# Patient Record
Sex: Male | Born: 1960 | Race: Black or African American | Hispanic: No | Marital: Single | Smoking: Never smoker
Health system: Southern US, Community
[De-identification: ages and names within clinical notes are randomized; demographics above are authoritative.]

## PROBLEM LIST (undated history)

## (undated) DIAGNOSIS — I10 Essential (primary) hypertension: Secondary | ICD-10-CM

## (undated) DIAGNOSIS — K7460 Unspecified cirrhosis of liver: Secondary | ICD-10-CM

## (undated) DIAGNOSIS — E119 Type 2 diabetes mellitus without complications: Secondary | ICD-10-CM

---

## 2018-12-25 ENCOUNTER — Emergency Department (HOSPITAL_BASED_OUTPATIENT_CLINIC_OR_DEPARTMENT_OTHER): Payer: Medicaid Other

## 2018-12-25 ENCOUNTER — Other Ambulatory Visit: Payer: Self-pay

## 2018-12-25 ENCOUNTER — Emergency Department (HOSPITAL_BASED_OUTPATIENT_CLINIC_OR_DEPARTMENT_OTHER)
Admission: EM | Admit: 2018-12-25 | Discharge: 2018-12-25 | Disposition: A | Discharge status: Other Acute Inpatient Hospital | Payer: Medicaid Other | Attending: Emergency Medicine | Admitting: Emergency Medicine

## 2018-12-25 ENCOUNTER — Encounter (HOSPITAL_BASED_OUTPATIENT_CLINIC_OR_DEPARTMENT_OTHER): Payer: Self-pay | Admitting: Adult Health

## 2018-12-25 DIAGNOSIS — N179 Acute kidney failure, unspecified: Secondary | ICD-10-CM | POA: Diagnosis not present

## 2018-12-25 DIAGNOSIS — R600 Localized edema: Secondary | ICD-10-CM | POA: Diagnosis not present

## 2018-12-25 DIAGNOSIS — A419 Sepsis, unspecified organism: Secondary | ICD-10-CM | POA: Insufficient documentation

## 2018-12-25 DIAGNOSIS — Z79899 Other long term (current) drug therapy: Secondary | ICD-10-CM | POA: Diagnosis not present

## 2018-12-25 DIAGNOSIS — R079 Chest pain, unspecified: Secondary | ICD-10-CM | POA: Diagnosis present

## 2018-12-25 DIAGNOSIS — I10 Essential (primary) hypertension: Secondary | ICD-10-CM | POA: Insufficient documentation

## 2018-12-25 DIAGNOSIS — E119 Type 2 diabetes mellitus without complications: Secondary | ICD-10-CM | POA: Insufficient documentation

## 2018-12-25 HISTORY — DX: Unspecified cirrhosis of liver: K74.60

## 2018-12-25 HISTORY — DX: Type 2 diabetes mellitus without complications: E11.9

## 2018-12-25 HISTORY — DX: Essential (primary) hypertension: I10

## 2018-12-25 LAB — CBC WITH DIFFERENTIAL/PLATELET
ABS IMMATURE GRANULOCYTES: 0.2 10*3/uL — AB (ref 0.00–0.07)
BASOS PCT: 0 %
Basophils Absolute: 0.1 10*3/uL (ref 0.0–0.1)
Eosinophils Absolute: 0.4 10*3/uL (ref 0.0–0.5)
Eosinophils Relative: 2 %
HCT: 35.7 % — ABNORMAL LOW (ref 39.0–52.0)
Hemoglobin: 11.1 g/dL — ABNORMAL LOW (ref 13.0–17.0)
Immature Granulocytes: 1 %
Lymphocytes Relative: 3 %
Lymphs Abs: 0.6 10*3/uL — ABNORMAL LOW (ref 0.7–4.0)
MCH: 30.2 pg (ref 26.0–34.0)
MCHC: 31.1 g/dL (ref 30.0–36.0)
MCV: 97.3 fL (ref 80.0–100.0)
Monocytes Absolute: 0.7 10*3/uL (ref 0.1–1.0)
Monocytes Relative: 4 %
Neutro Abs: 16.4 10*3/uL — ABNORMAL HIGH (ref 1.7–7.7)
Neutrophils Relative %: 90 %
PLATELETS: 85 10*3/uL — AB (ref 150–400)
RBC: 3.67 MIL/uL — ABNORMAL LOW (ref 4.22–5.81)
RDW: 15.3 % (ref 11.5–15.5)
WBC: 18.4 10*3/uL — ABNORMAL HIGH (ref 4.0–10.5)
nRBC: 0.1 % (ref 0.0–0.2)

## 2018-12-25 LAB — COMPREHENSIVE METABOLIC PANEL
ALT: 22 U/L (ref 0–44)
AST: 32 U/L (ref 15–41)
Albumin: 2.5 g/dL — ABNORMAL LOW (ref 3.5–5.0)
Alkaline Phosphatase: 68 U/L (ref 38–126)
Anion gap: 10 (ref 5–15)
BUN: 34 mg/dL — ABNORMAL HIGH (ref 6–20)
CO2: 13 mmol/L — ABNORMAL LOW (ref 22–32)
Calcium: 8.2 mg/dL — ABNORMAL LOW (ref 8.9–10.3)
Chloride: 115 mmol/L — ABNORMAL HIGH (ref 98–111)
Creatinine, Ser: 3.74 mg/dL — ABNORMAL HIGH (ref 0.61–1.24)
GFR calc Af Amer: 20 mL/min — ABNORMAL LOW (ref >60)
GFR calc non Af Amer: 17 mL/min — ABNORMAL LOW (ref >60)
Glucose, Bld: 67 mg/dL — ABNORMAL LOW (ref 70–99)
POTASSIUM: 4.4 mmol/L (ref 3.5–5.1)
Sodium: 138 mmol/L (ref 135–145)
Total Bilirubin: 1.6 mg/dL — ABNORMAL HIGH (ref 0.3–1.2)
Total Protein: 6.8 g/dL (ref 6.5–8.1)

## 2018-12-25 LAB — CBG MONITORING, ED
GLUCOSE-CAPILLARY: 53 mg/dL — AB (ref 70–99)
Glucose-Capillary: 96 mg/dL (ref 70–99)
Glucose-Capillary: 95 mg/dL (ref 70–99)
Glucose-Capillary: 134 mg/dL — ABNORMAL HIGH (ref 70–99)

## 2018-12-25 LAB — URINALYSIS, MICROSCOPIC (REFLEX)

## 2018-12-25 LAB — URINALYSIS, ROUTINE W REFLEX MICROSCOPIC
BILIRUBIN URINE: NEGATIVE
Glucose, UA: NEGATIVE mg/dL
KETONES UR: NEGATIVE mg/dL
Nitrite: NEGATIVE
Protein, ur: NEGATIVE mg/dL
Specific Gravity, Urine: 1.02 (ref 1.005–1.030)
pH: 6 (ref 5.0–8.0)

## 2018-12-25 LAB — LACTIC ACID, PLASMA
LACTIC ACID, VENOUS: 3.9 mmol/L — AB (ref 0.5–1.9)
Lactic Acid, Venous: 4.6 mmol/L (ref 0.5–1.9)
Lactic Acid, Venous: 4 mmol/L (ref 0.5–1.9)

## 2018-12-25 LAB — D-DIMER, QUANTITATIVE: D-Dimer, Quant: 14.96 ug/mL-FEU — ABNORMAL HIGH (ref 0.00–0.50)

## 2018-12-25 LAB — INFLUENZA PANEL BY PCR (TYPE A & B)
Influenza A By PCR: NEGATIVE
Influenza B By PCR: NEGATIVE

## 2018-12-25 LAB — TROPONIN I: Troponin I: <0.03 ng/mL (ref <0.03)

## 2018-12-25 MED ORDER — GLUCOSE 40 % PO GEL
ORAL | Status: AC
  Administered 2018-12-25: 11:00:00
  Filled 2018-12-25: qty 1

## 2018-12-25 MED ORDER — VANCOMYCIN VARIABLE DOSE PER UNSTABLE RENAL FUNCTION (PHARMACIST DOSING)
Status: DC
  Filled 2018-12-25: qty 1

## 2018-12-25 MED ORDER — VANCOMYCIN HCL IN DEXTROSE 1-5 GM/200ML-% IV SOLN
1000.0000 mg | Freq: Once | INTRAVENOUS | Status: AC
  Administered 2018-12-25: 1000 mg via INTRAVENOUS
  Filled 2018-12-25: qty 200

## 2018-12-25 MED ORDER — SODIUM CHLORIDE 0.9 % IV BOLUS
1000.0000 mL | Freq: Once | INTRAVENOUS | Status: AC
  Administered 2018-12-25: 1000 mL via INTRAVENOUS

## 2018-12-25 MED ORDER — SODIUM CHLORIDE 0.9 % IV SOLN
2.0000 g | Freq: Once | INTRAVENOUS | Status: AC
  Administered 2018-12-25: 2 g via INTRAVENOUS
  Filled 2018-12-25: qty 2

## 2018-12-25 MED ORDER — SODIUM CHLORIDE 0.9 % IV BOLUS (SEPSIS)
500.0000 mL | Freq: Once | INTRAVENOUS | Status: AC
  Administered 2018-12-25: 500 mL via INTRAVENOUS

## 2018-12-25 MED ORDER — SODIUM CHLORIDE 0.9 % IV BOLUS (SEPSIS)
1000.0000 mL | Freq: Once | INTRAVENOUS | Status: AC
  Administered 2018-12-25: 1000 mL via INTRAVENOUS

## 2018-12-25 MED ORDER — DIPHENHYDRAMINE HCL 50 MG/ML IJ SOLN
25.0000 mg | Freq: Once | INTRAMUSCULAR | Status: AC
  Administered 2018-12-25: 25 mg via INTRAVENOUS
  Filled 2018-12-25: qty 1

## 2018-12-25 MED ORDER — SODIUM CHLORIDE 0.9 % IV SOLN: 1.0000 g | INTRAVENOUS | Status: DC

## 2018-12-25 MED ORDER — CEFEPIME HCL 2 G IJ SOLR
INTRAMUSCULAR | Status: AC
  Filled 2018-12-25: qty 2

## 2018-12-25 MED ORDER — SODIUM CHLORIDE 0.9 % IV BOLUS (SEPSIS): 1000.0000 mL | Freq: Once | INTRAVENOUS | Status: DC

## 2018-12-25 MED ORDER — METRONIDAZOLE IN NACL 5-0.79 MG/ML-% IV SOLN
500.0000 mg | Freq: Three times a day (TID) | INTRAVENOUS | Status: DC
  Administered 2018-12-25 (×2): 500 mg via INTRAVENOUS
  Filled 2018-12-25: qty 100

## 2018-12-25 MED ORDER — ACETAMINOPHEN 500 MG PO TABS
1000.0000 mg | ORAL_TABLET | Freq: Once | ORAL | Status: AC
  Administered 2018-12-25: 1000 mg via ORAL
  Filled 2018-12-25: qty 2

## 2018-12-25 NOTE — ED Notes (Signed)
Date and time results received: 12/25/18 1949  Test: lactic Critical Value: 4.0  Name of Provider Notified: Dr. Adela Lank  Orders Received? Or Actions Taken?: no new orders

## 2018-12-25 NOTE — ED Provider Notes (Addendum)
[facility] EMERGENCY DEPARTMENT Provider Note   CSN: [id number] Arrival date & time: 12/25/18  8757     History   Chief Complaint Chief Complaint  Patient presents with  . Foot Pain  . Chest Pain    HPI Charles Serrano is a 58 y.o. male.  Patient is a 58 year old male with a history of hypertension, diabetes and cirrhosis.  Who presents with chest pain and leg swelling.  He states he has had some chest pain since yesterday.  It comes and goes.  He says it is hurting but cannot really describe it more.  Is nonpleuritic.  He just describes as a hurting type pain.  He denies any shortness of breath.  He says his right leg is swollen and is been like that for about the last week but has had trouble with that swelling in the past.  He denies any significant pain to the area.  He describes a rash to his bottom.  He denies any cough or cold symptoms.  No abdominal pain.  No vomiting or diarrhea.  His blood sugar is noted to be low on arrival and he denies eating breakfast this morning but says he was eating well yesterday.  He denies any known fevers.  He denies any alcohol or drug use.     Past Medical History:  Diagnosis Date  . Cirrhosis (HCC)   . Diabetes mellitus without complication (HCC)   . Hypertension     There are no active problems to display for this patient.   History reviewed. No pertinent surgical history.      Home Medications    Prior to Admission medications   Medication Sig Start Date End Date Taking? Authorizing Provider  furosemide (LASIX) 40 MG tablet  12/12/18   [provider]  glimepiride (AMARYL) 2 MG tablet  12/12/18   [provider]  KLOR-CON M10 10 MEQ tablet  10/13/18   [provider]  levofloxacin (LEVAQUIN) 500 MG tablet TK 1 T PO QD 10/10/18   [provider]  potassium chloride SA (K-DUR,KLOR-CON) 20 MEQ tablet  08/18/18   [provider]  propranolol (INDERAL) 20 MG tablet TK 1 T PO TID  10/10/18   [provider]  spironolactone (ALDACTONE) 50 MG tablet TK 1 T PO QD 10/10/18   [provider]  VENTOLIN HFA 108 (90 Base) MCG/ACT inhaler INL 2 PFS ITL Q 4 H 12/12/18   [provider]    Family History History reviewed. No pertinent family history.  Social History Social History   Tobacco Use  . Smoking status: Never Smoker  . Smokeless tobacco: Never Used  Substance Use Topics  . Alcohol use: Not Currently  . Drug use: Not Currently     Allergies   Patient has no known allergies.   Review of Systems Review of Systems  Constitutional: Positive for fatigue. Negative for chills, diaphoresis and fever.  HENT: Negative for congestion, rhinorrhea and sneezing.   Eyes: Negative.   Respiratory: Negative for cough, chest tightness and shortness of breath.   Cardiovascular: Positive for chest pain and leg swelling.  Gastrointestinal: Negative for abdominal pain, blood in stool, diarrhea, nausea and vomiting.  Genitourinary: Negative for difficulty urinating, flank pain, frequency and hematuria.  Musculoskeletal: Negative for arthralgias and back pain.  Skin: Positive for rash.  Neurological: Negative for dizziness, speech difficulty, weakness, numbness and headaches.     Physical Exam Updated Vital Signs BP 120/76 (BP Location: Right Arm)  Pulse (!) 101   Temp 97.7 F (36.5 C) (Oral)   Resp 16   Ht 5\' 8"  (1.727 m)   Wt 77.1 kg   SpO2 100%   BMI 25.85 kg/m   Physical Exam Constitutional:      Appearance: He is well-developed.     Comments: hypotensive  HENT:     Head: Normocephalic and atraumatic.  Eyes:     Pupils: Pupils are equal, round, and reactive to light.  Neck:     Musculoskeletal: Normal range of motion and neck supple.  Cardiovascular:     Rate and Rhythm: Regular rhythm. Tachycardia present.     Heart sounds: Normal heart sounds.  Pulmonary:     Effort: Pulmonary effort is normal. No respiratory distress.      Breath sounds: Normal breath sounds. No wheezing or rales.  Chest:     Chest wall: No tenderness.  Abdominal:     General: Bowel sounds are normal.     Palpations: Abdomen is soft.     Tenderness: There is no abdominal tenderness. There is no guarding or rebound.  Musculoskeletal: Normal range of motion.     Right lower leg: Edema present.     Comments: Patient has swelling of his right foot ankle and lower leg.  There is no tenderness on palpation.  There is no warmth or erythema.  Pedal pulses are intact.  Lymphadenopathy:     Cervical: No cervical adenopathy.  Skin:    General: Skin is warm and dry.     Findings: No rash.     Comments: There is some small excoriated lesions on his right buttocks but I do not see any underlying rash.  Neurological:     Mental Status: He is alert and oriented to person, place, and time.      ED Treatments / Results  Labs (all labs ordered are listed, but only abnormal results are displayed) Labs Reviewed  COMPREHENSIVE METABOLIC PANEL - Abnormal; Notable for the following components:      Result Value   Chloride 115 (*)    CO2 13 (*)    Glucose, Bld 67 (*)    BUN 34 (*)    Creatinine, Ser 3.74 (*)    Calcium 8.2 (*)    Albumin 2.5 (*)    Total Bilirubin 1.6 (*)    GFR calc non Af Amer 17 (*)    GFR calc Af Amer 20 (*)    All other components within normal limits  CBC WITH DIFFERENTIAL/PLATELET - Abnormal; Notable for the following components:   WBC 18.4 (*)    RBC 3.67 (*)    Hemoglobin 11.1 (*)    HCT 35.7 (*)    Platelets 85 (*)    Neutro Abs 16.4 (*)    Lymphs Abs 0.6 (*)    Abs Immature Granulocytes 0.20 (*)    All other components within normal limits  D-DIMER, QUANTITATIVE (NOT AT [facility]) - Abnormal; Notable for the following components:   D-Dimer, Quant 14.96 (*)    All other components within normal limits  LACTIC ACID, PLASMA - Abnormal; Notable for the following components:   Lactic Acid, Venous 3.9 (*)    All other  components within normal limits  URINALYSIS, ROUTINE W REFLEX MICROSCOPIC - Abnormal; Notable for the following components:   APPearance HAZY (*)    Hgb urine dipstick LARGE (*)    Leukocytes,Ua MODERATE (*)    All other components within normal limits  URINALYSIS, MICROSCOPIC (  REFLEX) - Abnormal; Notable for the following components:   Bacteria, UA FEW (*)    All other components within normal limits  CBG MONITORING, ED - Abnormal; Notable for the following components:   Glucose-Capillary 53 (*)    All other components within normal limits  CULTURE, BLOOD (ROUTINE X 2)  CULTURE, BLOOD (ROUTINE X 2)  URINE CULTURE  TROPONIN I  LACTIC ACID, PLASMA  INFLUENZA PANEL BY PCR (TYPE A & B)  CBG MONITORING, ED  CBG MONITORING, ED    EKG EKG Interpretation  Date/Time:  Friday December 25 2018 09:48:54 EST Ventricular Rate:  97 PR Interval:    QRS Duration: 86 QT Interval:  377 QTC Calculation: 479 R Axis:   57 Text Interpretation:  Sinus rhythm ST elevation, consider anterior injury Borderline prolonged QT interval No old tracing to compare Confirmed by Rolan BuccoBelfi, Ena Demary [phone]([id number]) on 12/25/2018 10:12:34 AM Also confirmed by Rolan BuccoBelfi, Eliane Hammersmith [phone]), editor Barbette Hairassel, Kerry [phone]([id number])  on 12/25/2018 11:26:02 AM   Radiology Dg Chest 2 View  Result Date: 12/25/2018 CLINICAL DATA:  Hypotension. EXAM: CHEST - 2 VIEW COMPARISON:  10/08/2018 FINDINGS: Grossly unchanged borderline enlarged cardiac silhouette and mediastinal contours. Minimal perihilar opacities favored to represent atelectasis. Mild pulmonary venous congestion without frank evidence of edema. Evaluation of the retrosternal clear space obscured secondary to overlying osseous and soft tissue structures. No discrete focal airspace opacities. No pleural effusion or pneumothorax. No acute osseus abnormalities. IMPRESSION: Similar findings of perihilar atelectasis and pulmonary venous congestion without definitive superimposed acute cardiopulmonary  disease. Specifically, no focal airspace opacities to suggest pneumonia. Electronically Signed   By: Simonne ComeJohn  Watts M.D.   On: 12/25/2018 11:04   [place] Venous Img Lower Right (dvt Study)  Result Date: 12/25/2018 CLINICAL DATA:  Right lower extremity pain and edema for the past 2 years. Evaluate for DVT. EXAM: RIGHT LOWER EXTREMITY VENOUS DOPPLER ULTRASOUND TECHNIQUE: Gray-scale sonography with graded compression, as well as color Doppler and duplex ultrasound were performed to evaluate the lower extremity deep venous systems from the level of the common femoral vein and including the common femoral, femoral, profunda femoral, popliteal and calf veins including the posterior tibial, peroneal and gastrocnemius veins when visible. The superficial great saphenous vein was also interrogated. Spectral Doppler was utilized to evaluate flow at rest and with distal augmentation maneuvers in the common femoral, femoral and popliteal veins. COMPARISON:  None. FINDINGS: Contralateral Common Femoral Vein: Respiratory phasicity is normal and symmetric with the symptomatic side. No evidence of thrombus. Normal compressibility. Common Femoral Vein: No evidence of thrombus. Normal compressibility, respiratory phasicity and response to augmentation. Saphenofemoral Junction: No evidence of thrombus. Normal compressibility and flow on color Doppler imaging. Profunda Femoral Vein: No evidence of thrombus. Normal compressibility and flow on color Doppler imaging. Femoral Vein: No evidence of thrombus. Normal compressibility, respiratory phasicity and response to augmentation. Popliteal Vein: No evidence of thrombus. Normal compressibility, respiratory phasicity and response to augmentation. Calf Veins: No evidence of thrombus. Normal compressibility and flow on color Doppler imaging. Superficial Great Saphenous Vein: No evidence of thrombus. Normal compressibility. Venous Reflux:  None. Other Findings: There is a serpiginous approximately  2.7 x 0.8 x 1.4 cm fluid collection about the right popliteal fossa presumably a Baker cyst. Moderate amount of subcutaneous edema is noted at the level the left lower leg and calf. IMPRESSION: No evidence of DVT within the right lower extremity. Electronically Signed   By: Simonne ComeJohn  Watts M.D.   On: 12/25/2018 11:02    Procedures  Procedures (including critical care time)  Medications Ordered in ED Medications  metroNIDAZOLE (FLAGYL) IVPB 500 mg (0 mg Intravenous Stopped 12/25/18 1403)  ceFEPIme (MAXIPIME) 1 g in sodium chloride 0.9 % 100 mL IVPB (has no administration in time range)  vancomycin variable dose per unstable renal function (pharmacist dosing) (has no administration in time range)  ceFEPIme (MAXIPIME) 2 g injection (has no administration in time range)  acetaminophen (TYLENOL) tablet 1,000 mg (has no administration in time range)  dextrose (GLUTOSE) 40 % oral gel (  Given 12/25/18 1048)  sodium chloride 0.9 % bolus 1,000 mL (0 mLs Intravenous Stopped 12/25/18 1158)  sodium chloride 0.9 % bolus 1,000 mL (0 mLs Intravenous Stopped 12/25/18 1402)    And  sodium chloride 0.9 % bolus 500 mL (0 mLs Intravenous Stopped 12/25/18 1402)  ceFEPIme (MAXIPIME) 2 g in sodium chloride 0.9 % 100 mL IVPB (0 g Intravenous Stopped 12/25/18 1244)  vancomycin (VANCOCIN) IVPB 1000 mg/200 mL premix (0 mg Intravenous Stopped 12/25/18 1403)  diphenhydrAMINE (BENADRYL) injection 25 mg (25 mg Intravenous Given 12/25/18 1354)     Initial Impression / Assessment and Plan / ED Course  I have reviewed the triage vital signs and the nursing notes.  Pertinent labs & imaging results that were available during my care of the patient were reviewed by me and considered in my medical decision making (see chart for details).     PT is a 57yo with prior hx of ETOH related cirrhosis, prior substance abuse, DM, HTN who presents with generalized fatigue some right leg swelling and chest pain.  He does meet sepsis criteria.  He  was hypotensive on arrival but this improved with IV fluids.  His lactate was markedly elevated at over 3.  His white count is elevated.  He also has evidence of an acute kidney injury with a previously normal creatinine.  His potassium is okay.  His leg is slightly warm and swollen.  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  At this point it could be the source of infection.  I do not find any other evidence of bacterial infections.  His chest x-ray does not show evidence of pneumonia.  He does report some slight pain in his posterior neck but he has full range of motion of the neck without meningeal symptoms.  He denies any headache.  He does not have other symptoms that would be more concerning for meningitis.  His urine is still pending.  He does not have abdominal pain.  He initially reported some chest pain but this has resolved.  Given his leg swelling, I did do a Doppler ultrasound which showed no evidence of DVT.  His d-dimer is elevated but he does not have other suggestions of PE at this point.  He has no ongoing chest pain.  No hypoxia.  No persistent tachycardia.  At this point I will not start him on anticoagulants.  Were unable to do a CT angios of his chest given his elevated creatinine.  He was given IV fluids and broad-spectrum antibiotics.  [facility]  I spoke with the [facility] transfer nurse who has accepted the pt for Dr. Harvie JuniorLal.    CRITICAL CARE Performed by: Rolan BuccoMelanie Loryn Haacke Total critical care time:45 minutes Critical care time was exclusive of separately billable procedures and treating other patients. Critical care was necessary to treat or prevent imminent or life-threatening deterioration. Critical care was time spent personally by me on the following activities: development  of treatment plan with patient and/or surrogate as well as nursing, discussions with consultants, evaluation of patient's response to treatment, examination of patient,  obtaining history from patient or surrogate, ordering and performing treatments and interventions, ordering and review of laboratory studies, ordering and review of radiographic studies, pulse oximetry and re-evaluation of patient's condition.  Final Clinical Impressions(s) / ED Diagnoses   Final diagnoses:  Sepsis, due to unspecified organism, unspecified whether acute organ dysfunction present [facility])  AKI (acute kidney injury) [facility])    ED Discharge Orders    None       Rolan Bucco, MD 12/25/18 1520    Rolan Bucco, MD 01/06/19 1840

## 2018-12-25 NOTE — ED Notes (Addendum)
2nd Lactic acid draw delayed, pt still has of saline left on his infusion. Initially unable to hang saline on pump d/t rate, and the saline stopped infusing for approximately 30 minutes d/t pt's position.

## 2018-12-25 NOTE — ED Notes (Signed)
Arranged transport with [facility] - [facility] was delayed.  Made arrangements with [facility].  [facility] then called to pick up patient

## 2018-12-25 NOTE — ED Notes (Signed)
Pt on auto VS  

## 2018-12-25 NOTE — ED Notes (Signed)
ED Provider at bedside. 

## 2018-12-25 NOTE — Progress Notes (Signed)
Pharmacy Antibiotic Note  Charles Serrano is a 58 y.o. male admitted on 12/25/2018 with sepsis.  Pharmacy has been consulted for Vancomycin and Cefepime dosing.  Plan: Cefepime 2 grams IV x 1 in the ED, then 1 gram IV q24hr Vanc 1 gram IV x 1 in the ED, then variable dosing by pharmacy based on levels Monitor renal function, C&S, and vanc levels as needed  Height: 5\' 8"  (172.7 cm) Weight: 170 lb (77.1 kg) IBW/kg (Calculated) : 68.4  Temp (24hrs), Avg:97.7 F (36.5 C), Min:97.7 F (36.5 C), Max:97.7 F (36.5 C)  Recent Labs  Lab 12/25/18 1011  WBC 18.4*  CREATININE 3.74*  LATICACIDVEN 3.9*    Estimated Creatinine Clearance: 21.6 mL/min (A) (by C-G formula based on SCr of 3.74 mg/dL (H)).    No Known Allergies  Antimicrobials this admission: Vanc 2/14 >>  Cefepime 2/14 >>  Thank you for allowing pharmacy to be a part of this patient's care.  Jeanella Cara, PharmD, [facility] Clinical Pharmacist Please see AMION for all Pharmacists' Contact Phone Numbers 12/25/2018, 11:06 AM

## 2018-12-25 NOTE — ED Triage Notes (Signed)
PResents with swollen right foot that is warm to touch and painful. HE is also c/o Chest pain and a rash to his bottom. He is hypotensive 84/55. HE is difficult to get a response from.

## 2018-12-25 NOTE — ED Notes (Signed)
Frozen dinner given.

## 2018-12-25 NOTE — ED Notes (Signed)
Pt assisted to restroom by EMT, lines changed and pt repositioned when he returned to bed. Pt back on monitor, both IVs flushed and are patent, pt resting currently and updated to delay of transport to [facility].

## 2018-12-26 ENCOUNTER — Telehealth (HOSPITAL_BASED_OUTPATIENT_CLINIC_OR_DEPARTMENT_OTHER): Payer: Self-pay | Admitting: Emergency Medicine

## 2018-12-26 LAB — BLOOD CULTURE ID PANEL (REFLEXED)
Acinetobacter baumannii: NOT DETECTED
Candida albicans: NOT DETECTED
Candida glabrata: NOT DETECTED
Candida krusei: NOT DETECTED
Candida parapsilosis: NOT DETECTED
Candida tropicalis: NOT DETECTED
Enterobacter cloacae complex: NOT DETECTED
Enterobacteriaceae species: NOT DETECTED
Enterococcus species: NOT DETECTED
Escherichia coli: NOT DETECTED
Haemophilus influenzae: NOT DETECTED
Klebsiella oxytoca: NOT DETECTED
Klebsiella pneumoniae: NOT DETECTED
Listeria monocytogenes: NOT DETECTED
Methicillin resistance: NOT DETECTED
Neisseria meningitidis: NOT DETECTED
Proteus species: NOT DETECTED
Pseudomonas aeruginosa: NOT DETECTED
STREPTOCOCCUS PYOGENES: NOT DETECTED
Serratia marcescens: NOT DETECTED
Staphylococcus aureus (BCID): DETECTED — AB
Staphylococcus species: DETECTED — AB
Streptococcus agalactiae: NOT DETECTED
Streptococcus pneumoniae: NOT DETECTED
Streptococcus species: NOT DETECTED

## 2018-12-26 LAB — URINE CULTURE: Culture: NO GROWTH

## 2018-12-26 NOTE — Telephone Encounter (Signed)
Received positive blood culture results from micro lab. Pt was transferred to [facility]. I called [facility] and gave result to pt's current nurse, Environmental education officer. She took results and states she will relay to his MD.

## 2018-12-28 LAB — CULTURE, BLOOD (ROUTINE X 2): Special Requests: ADEQUATE

## 2019-01-15 MED ORDER — THIAMINE HCL 100 MG PO TABS
100.00 | ORAL_TABLET | ORAL | Status: DC
Start: 2019-01-16 — End: 2019-01-15

## 2019-01-15 MED ORDER — CEFAZOLIN SODIUM-DEXTROSE 2-3 GM-%(50ML) IV SOLR
2.00 | INTRAVENOUS | Status: DC
Start: 2019-01-15 — End: 2019-01-15

## 2019-01-15 MED ORDER — PROPRANOLOL HCL 10 MG PO TABS
10.00 | ORAL_TABLET | ORAL | Status: DC
Start: 2019-01-15 — End: 2019-01-15

## 2019-01-15 MED ORDER — BENZONATATE 100 MG PO CAPS
100.00 | ORAL_CAPSULE | ORAL | Status: DC
Start: ? — End: 2019-01-15

## 2019-01-15 MED ORDER — GLUCOSE 40 % PO GEL
15.00 | ORAL | Status: DC
Start: ? — End: 2019-01-15

## 2019-01-15 MED ORDER — ZOLPIDEM TARTRATE 5 MG PO TABS
5.00 | ORAL_TABLET | ORAL | Status: DC
Start: ? — End: 2019-01-15

## 2019-01-15 MED ORDER — DIPHENHYDRAMINE HCL 25 MG PO CAPS
25.00 | ORAL_CAPSULE | ORAL | Status: DC
Start: ? — End: 2019-01-15

## 2019-01-15 MED ORDER — ALPRAZOLAM 0.25 MG PO TABS
.25 | ORAL_TABLET | ORAL | Status: DC
Start: ? — End: 2019-01-15

## 2019-01-15 MED ORDER — LACTULOSE 10 GM/15ML PO SOLN
10.00 | ORAL | Status: DC
Start: 2019-01-15 — End: 2019-01-15

## 2019-01-15 MED ORDER — NITROGLYCERIN 0.4 MG SL SUBL
0.40 | SUBLINGUAL_TABLET | SUBLINGUAL | Status: DC
Start: ? — End: 2019-01-15

## 2019-01-15 MED ORDER — POTASSIUM CHLORIDE CRYS ER 20 MEQ PO TBCR
20.00 | EXTENDED_RELEASE_TABLET | ORAL | Status: DC
Start: 2019-01-16 — End: 2019-01-15

## 2019-01-15 MED ORDER — HEPARIN SODIUM LOCK FLUSH 100 UNIT/ML IV SOLN
300.00 | INTRAVENOUS | Status: DC
Start: 2019-01-15 — End: 2019-01-15

## 2019-01-15 MED ORDER — RIFAXIMIN 550 MG PO TABS
550.00 | ORAL_TABLET | ORAL | Status: DC
Start: 2019-01-15 — End: 2019-01-15

## 2019-01-15 MED ORDER — ONDANSETRON HCL 4 MG/2ML IJ SOLN
4.00 | INTRAMUSCULAR | Status: DC
Start: ? — End: 2019-01-15

## 2019-01-15 MED ORDER — ALBUTEROL SULFATE HFA 108 (90 BASE) MCG/ACT IN AERS
2.00 | INHALATION_SPRAY | RESPIRATORY_TRACT | Status: DC
Start: ? — End: 2019-01-15

## 2019-01-15 MED ORDER — ENOXAPARIN SODIUM 40 MG/0.4ML ~~LOC~~ SOLN
40.00 | SUBCUTANEOUS | Status: DC
Start: 2019-01-15 — End: 2019-01-15

## 2019-01-15 MED ORDER — OXYCODONE HCL 5 MG PO TABS
5.00 | ORAL_TABLET | ORAL | Status: DC
Start: ? — End: 2019-01-15

## 2019-01-15 MED ORDER — CULTURELLE PO CAPS
1.00 | ORAL_CAPSULE | ORAL | Status: DC
Start: 2019-01-16 — End: 2019-01-15

## 2019-01-15 MED ORDER — DEXTROSE 10 % IV SOLN
125.00 | INTRAVENOUS | Status: DC
Start: ? — End: 2019-01-15

## 2019-01-15 MED ORDER — GLUCAGON HCL RDNA (DIAGNOSTIC) 1 MG IJ SOLR
1.00 | INTRAMUSCULAR | Status: DC
Start: ? — End: 2019-01-15

## 2019-01-15 MED ORDER — INSULIN LISPRO 100 UNIT/ML ~~LOC~~ SOLN
2.00 | SUBCUTANEOUS | Status: DC
Start: 2019-01-15 — End: 2019-01-15

## 2019-01-15 MED ORDER — FOLIC ACID 1 MG PO TABS
1.00 | ORAL_TABLET | ORAL | Status: DC
Start: 2019-01-16 — End: 2019-01-15

## 2019-01-15 MED ORDER — DILTIAZEM HCL ER COATED BEADS 120 MG PO CP24
120.00 | ORAL_CAPSULE | ORAL | Status: DC
Start: 2019-01-16 — End: 2019-01-15

## 2019-03-19 MED ORDER — PANTOPRAZOLE SODIUM 40 MG PO TBEC
40.00 | DELAYED_RELEASE_TABLET | ORAL | Status: DC
Start: 2019-03-19 — End: 2019-03-19

## 2019-03-19 MED ORDER — SODIUM CHLORIDE FLUSH 0.9 % IV SOLN
5.00 | INTRAVENOUS | Status: DC
Start: ? — End: 2019-03-19

## 2019-03-19 MED ORDER — LACTULOSE 10 GM/15ML PO SOLN
20.00 | ORAL | Status: DC
Start: 2019-03-19 — End: 2019-03-19

## 2019-03-19 MED ORDER — PROPRANOLOL HCL 40 MG PO TABS
40.00 | ORAL_TABLET | ORAL | Status: DC
Start: 2019-03-19 — End: 2019-03-19

## 2019-03-19 MED ORDER — HYDRALAZINE HCL 20 MG/ML IJ SOLN
10.00 | INTRAMUSCULAR | Status: DC
Start: ? — End: 2019-03-19

## 2019-03-19 MED ORDER — SODIUM CHLORIDE FLUSH 0.9 % IV SOLN
5.00 | INTRAVENOUS | Status: DC
Start: 2019-03-19 — End: 2019-03-19

## 2019-03-19 MED ORDER — FUROSEMIDE 10 MG/ML IJ SOLN
40.00 | INTRAMUSCULAR | Status: DC
Start: 2019-03-19 — End: 2019-03-19

## 2019-03-19 MED ORDER — RIFAXIMIN 550 MG PO TABS
550.00 | ORAL_TABLET | ORAL | Status: DC
Start: 2019-03-19 — End: 2019-03-19

## 2019-03-19 MED ORDER — DIPHENHYDRAMINE HCL 50 MG/ML IJ SOLN
25.00 | INTRAMUSCULAR | Status: DC
Start: ? — End: 2019-03-19

## 2019-03-19 MED ORDER — ONDANSETRON HCL 4 MG/2ML IJ SOLN
4.00 | INTRAMUSCULAR | Status: DC
Start: ? — End: 2019-03-19

## 2019-03-19 MED ORDER — ALBUTEROL SULFATE (2.5 MG/3ML) 0.083% IN NEBU
2.50 | INHALATION_SOLUTION | RESPIRATORY_TRACT | Status: DC
Start: ? — End: 2019-03-19

## 2019-03-19 MED ORDER — GLUCOSAMINE-CHONDROIT-COLLAGEN PO
100.00 | ORAL | Status: DC
Start: ? — End: 2019-03-19

## 2019-03-19 MED ORDER — HEPARIN SODIUM (PORCINE) 5000 UNIT/ML IJ SOLN
5000.00 | INTRAMUSCULAR | Status: DC
Start: 2019-03-19 — End: 2019-03-19

## 2019-03-19 MED ORDER — ALBUTEROL SULFATE (2.5 MG/3ML) 0.083% IN NEBU
2.50 | INHALATION_SOLUTION | RESPIRATORY_TRACT | Status: DC
Start: 2019-03-19 — End: 2019-03-19

## 2019-03-19 MED ORDER — Medication
2.00 | Status: DC
Start: 2019-03-19 — End: 2019-03-19

## 2019-03-19 MED ORDER — Medication
30.00 | Status: DC
Start: ? — End: 2019-03-19

## 2020-05-27 IMAGING — DX DG CHEST 2V
2 series · 2 of 2 positions shown · non-contrast
Comparison: 10/08/2018

CLINICAL DATA: Hypotension.

EXAM:
CHEST - 2 VIEW

[chest lat]
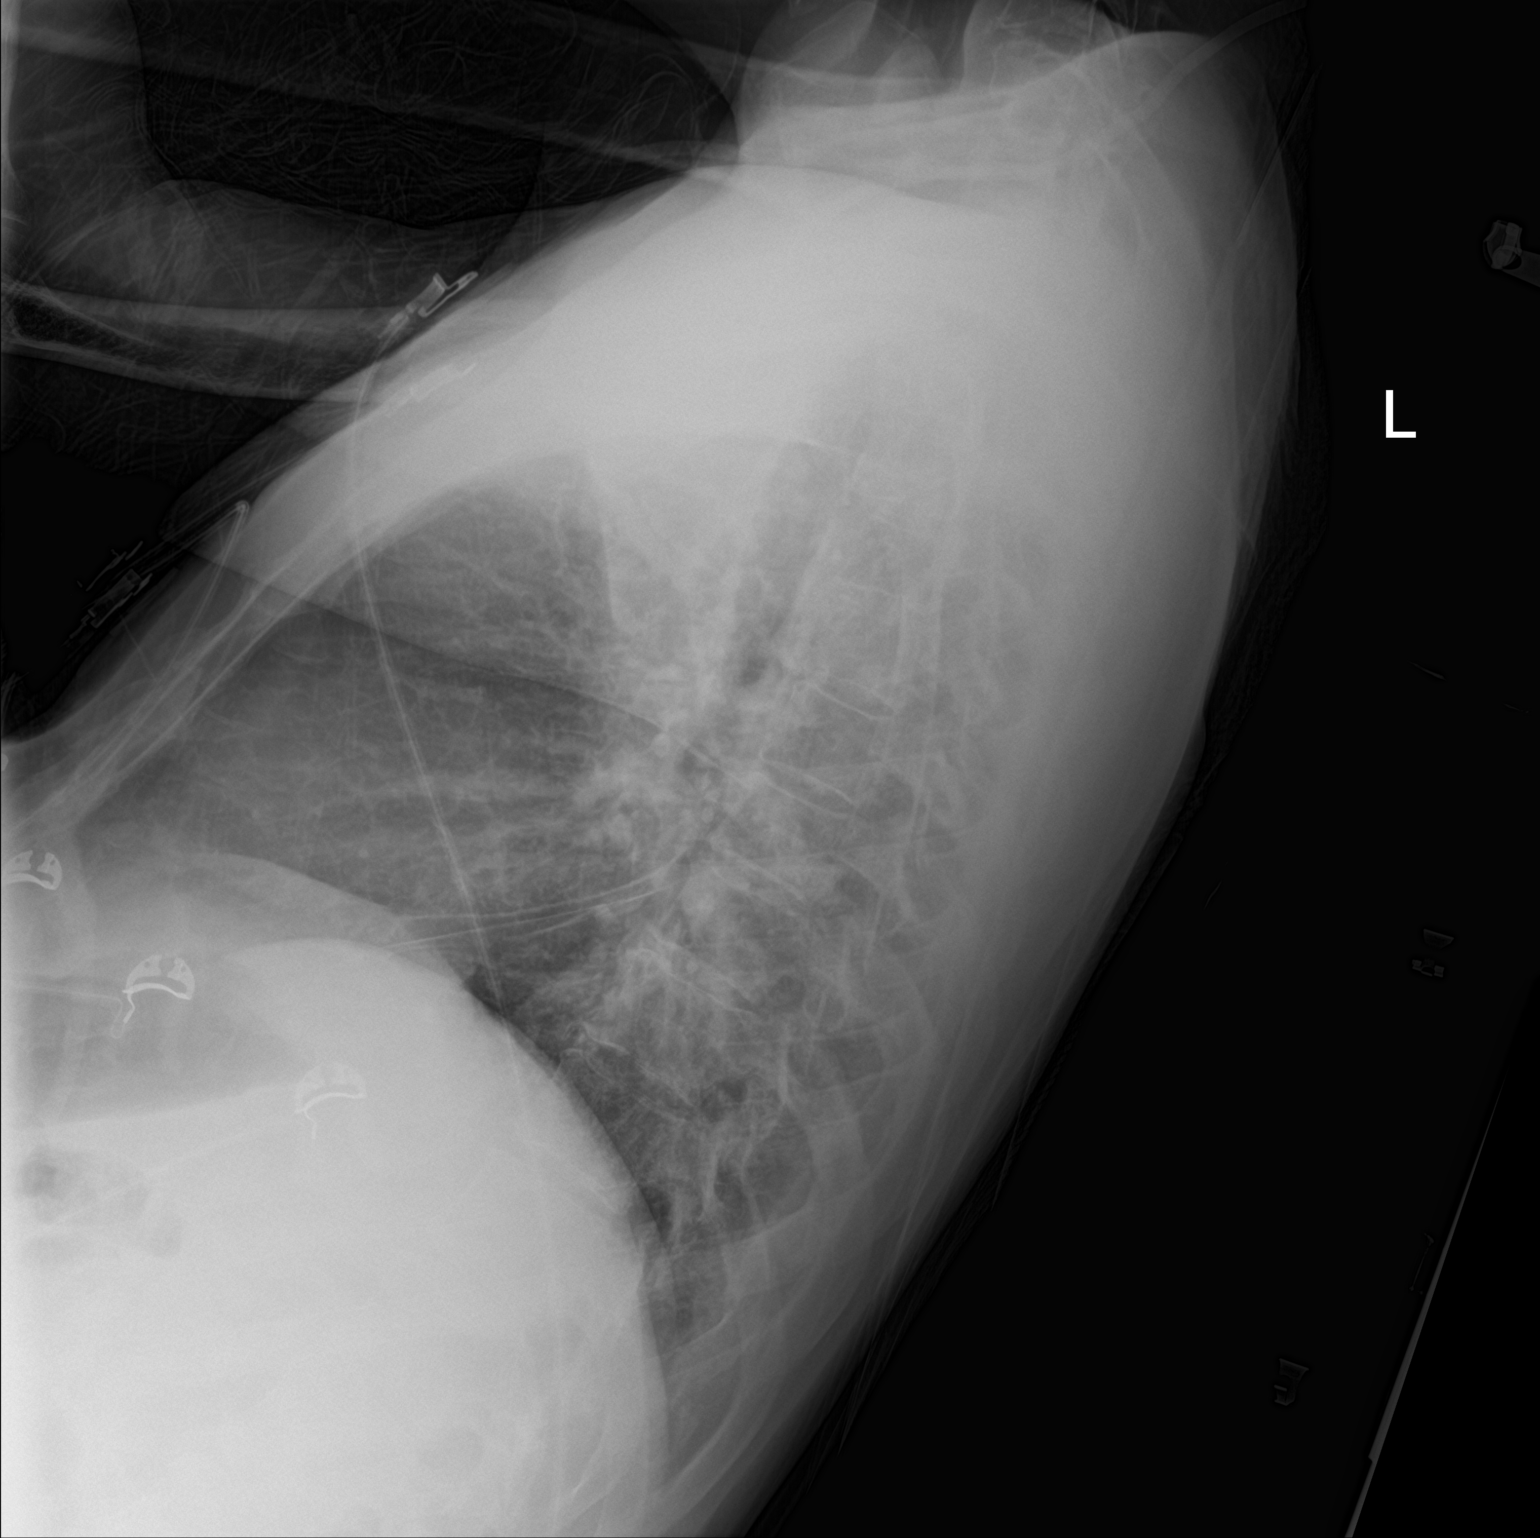

[chest ap strecther]
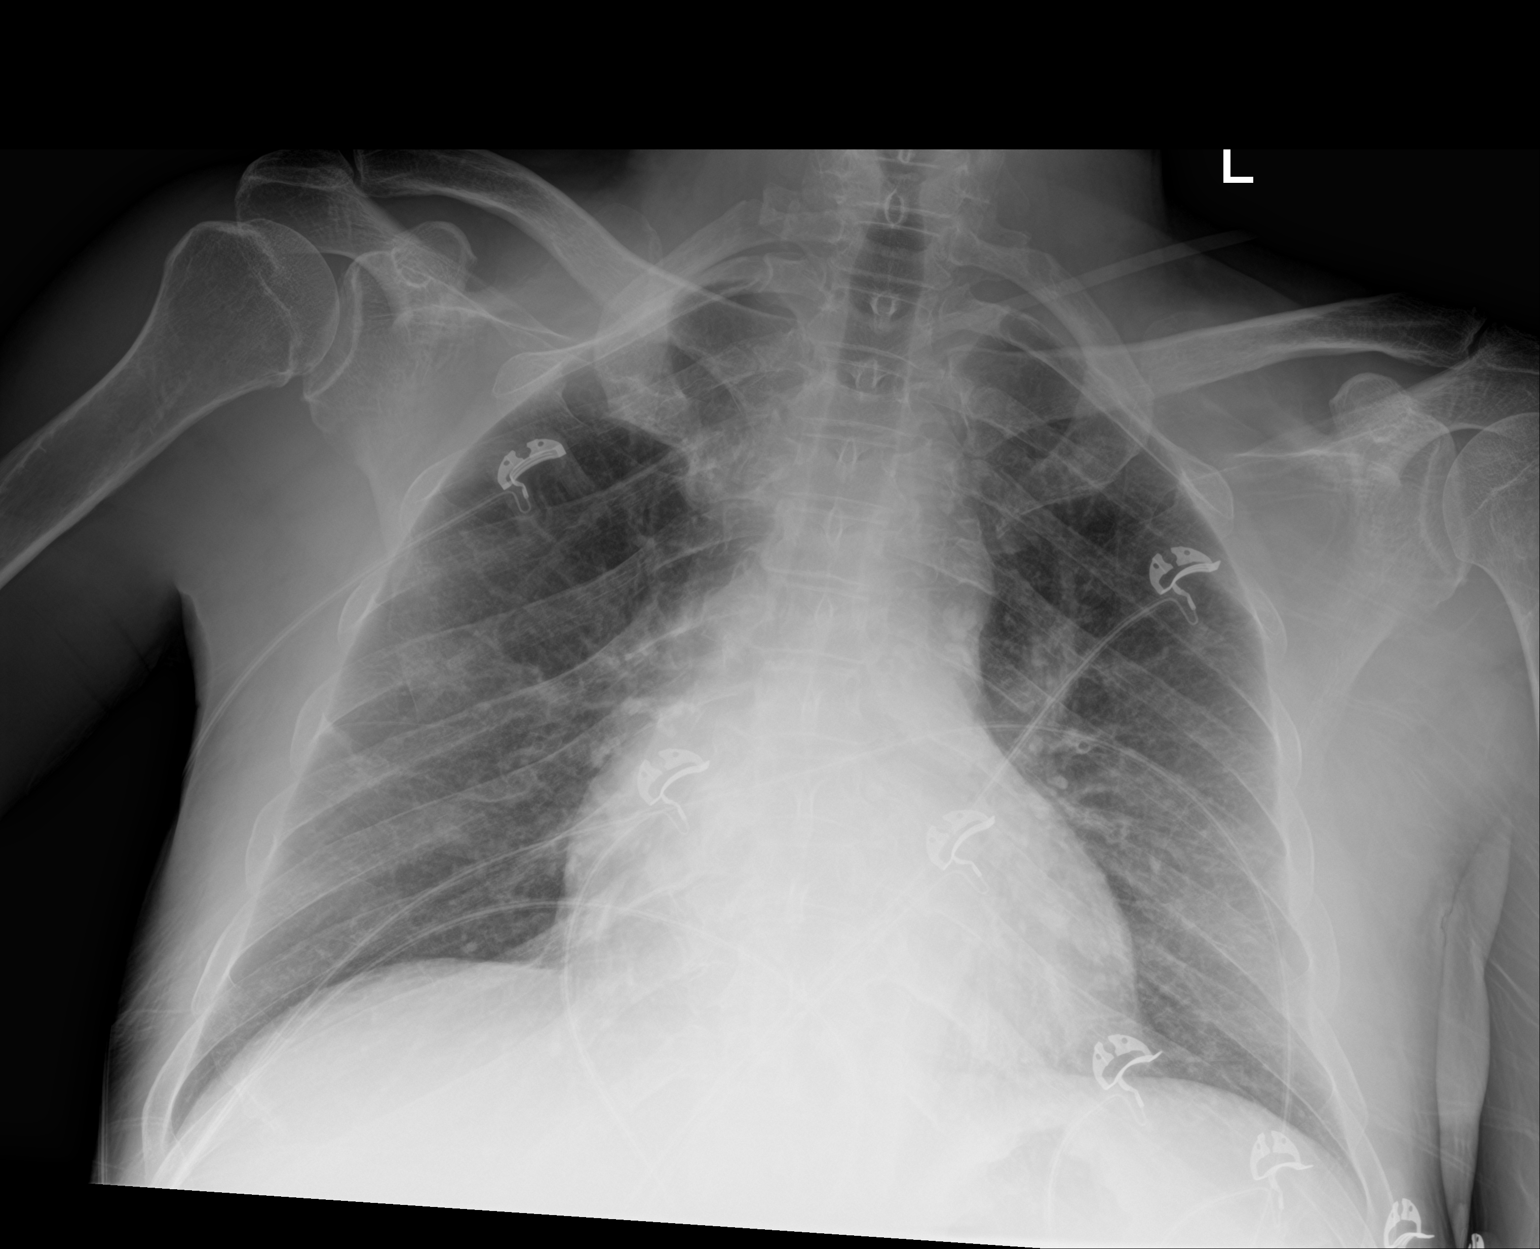

[2 of 2 positions shown; findings below may reference images not displayed]

FINDINGS: Grossly unchanged borderline enlarged cardiac silhouette and
mediastinal contours. Minimal perihilar opacities favored to
represent atelectasis. Mild pulmonary venous congestion without
frank evidence of edema. Evaluation of the retrosternal clear space
obscured secondary to overlying osseous and soft tissue structures.
No discrete focal airspace opacities. No pleural effusion or
pneumothorax. No acute osseus abnormalities.
IMPRESSION: Similar findings of perihilar atelectasis and pulmonary venous
congestion without definitive superimposed acute cardiopulmonary
disease. Specifically, no focal airspace opacities to suggest
pneumonia.

## 2020-08-11 IMAGING — US US EXTREM LOW VENOUS*R*
1 series · 13 of 24 positions shown · non-contrast
Comparison: None.

CLINICAL DATA: Right lower extremity pain and edema for the past 2
years. Evaluate for DVT.



[Series 1: us extrem low venous*right* · 0.08mm/px · 13 of 38 slices shown]
[im 1/38]
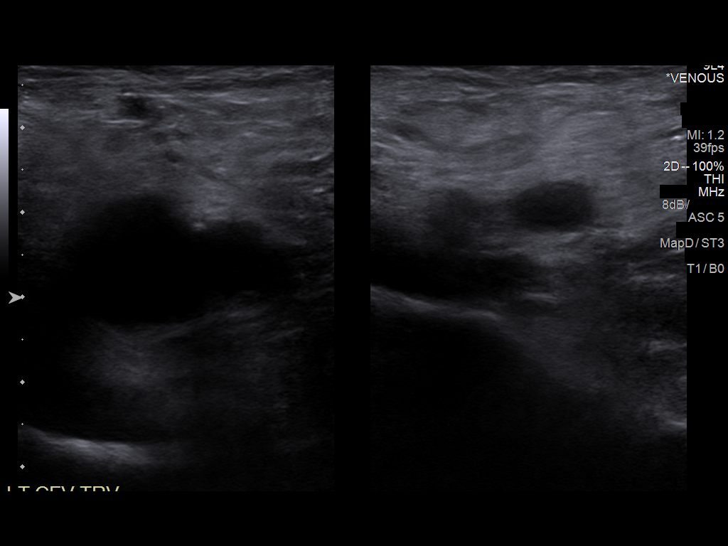
[im 4/38]
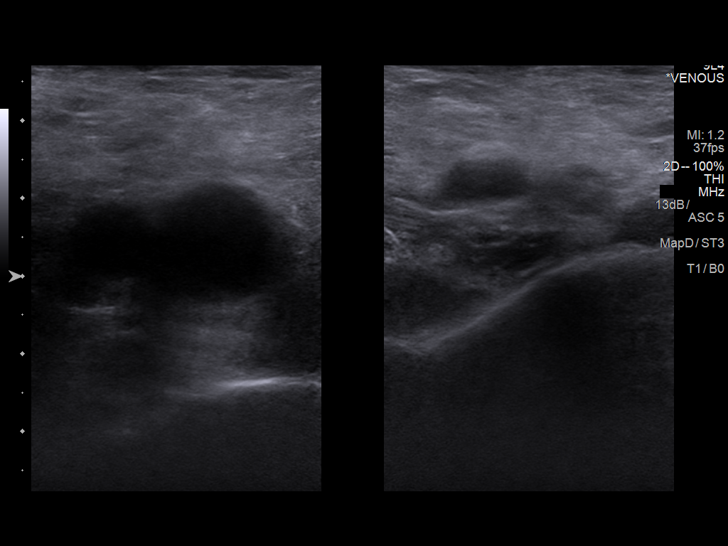
[im 7/38]
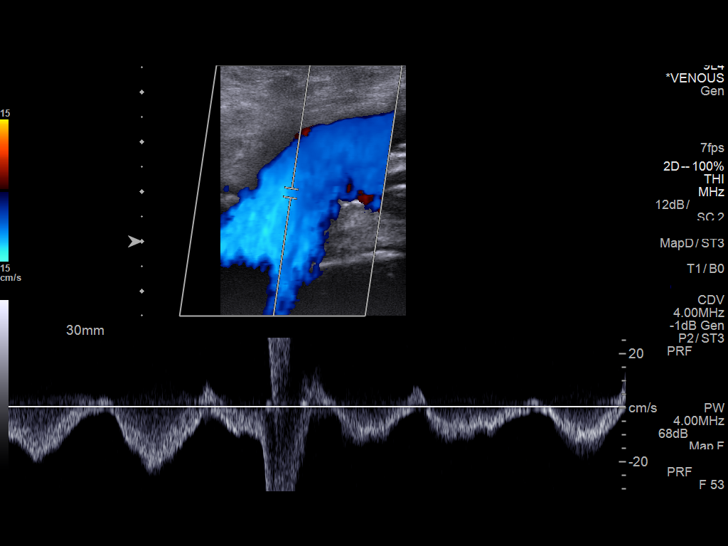
[im 10/38]
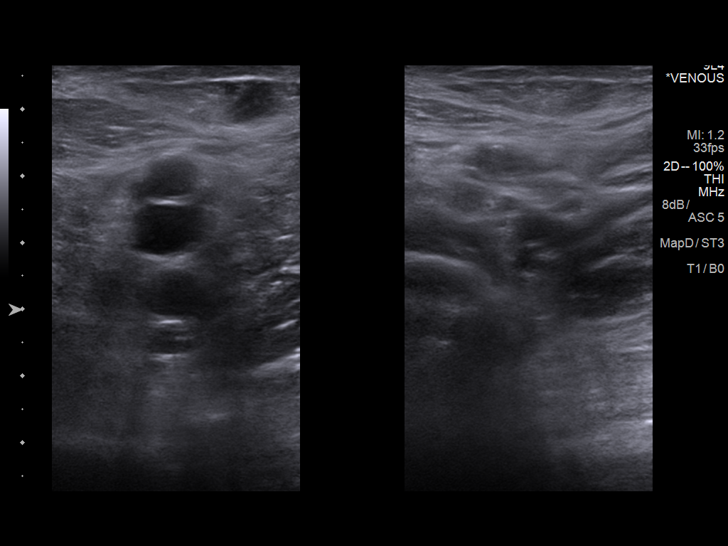
[im 13/38]
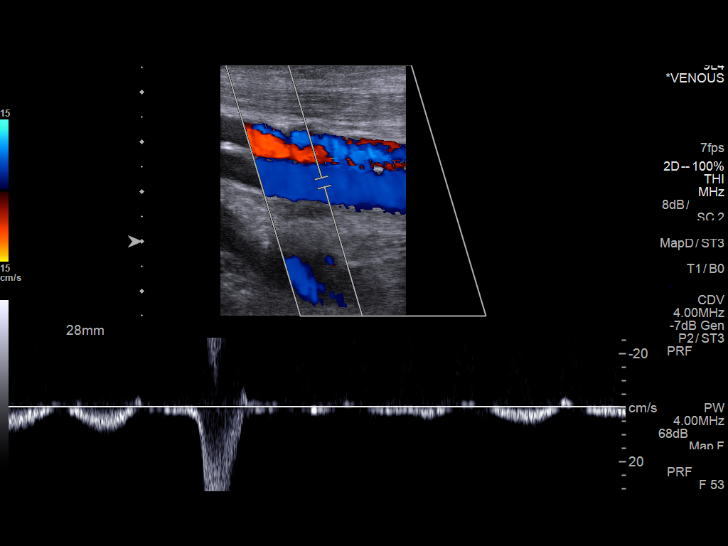
[im 17/38]
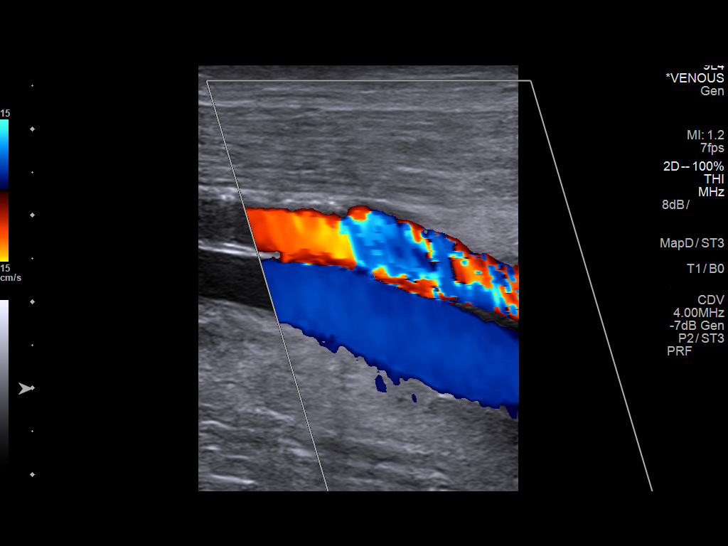
[im 20/38]
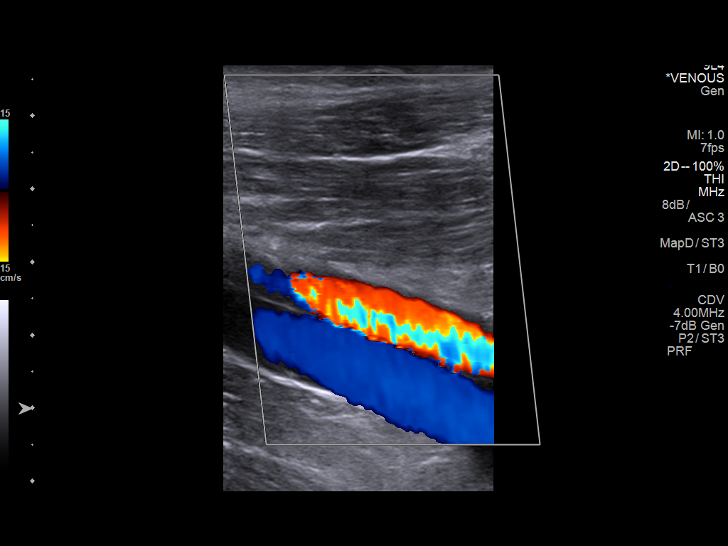
[im 21/38]
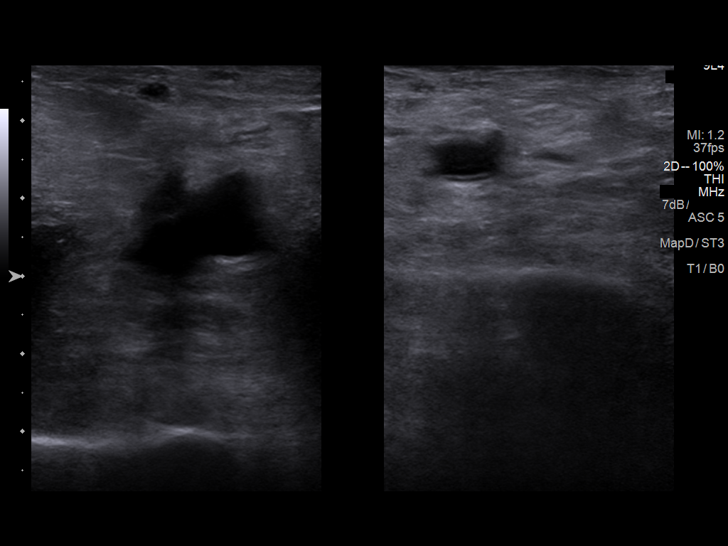
[im 25/38]
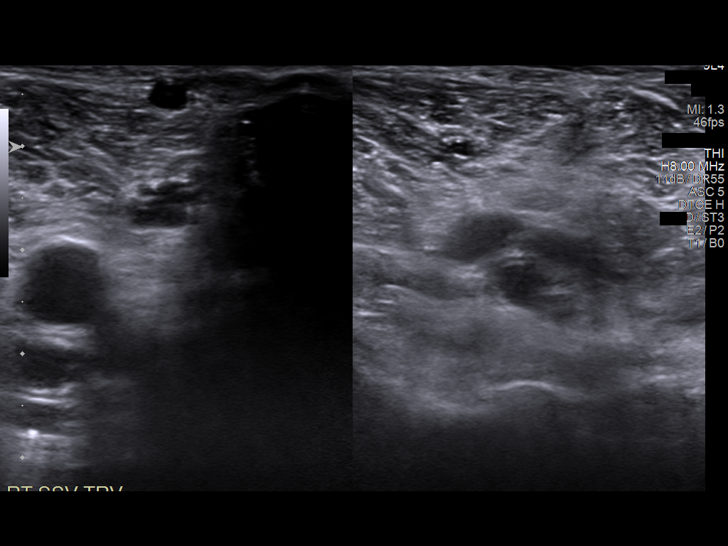
[im 28/38]
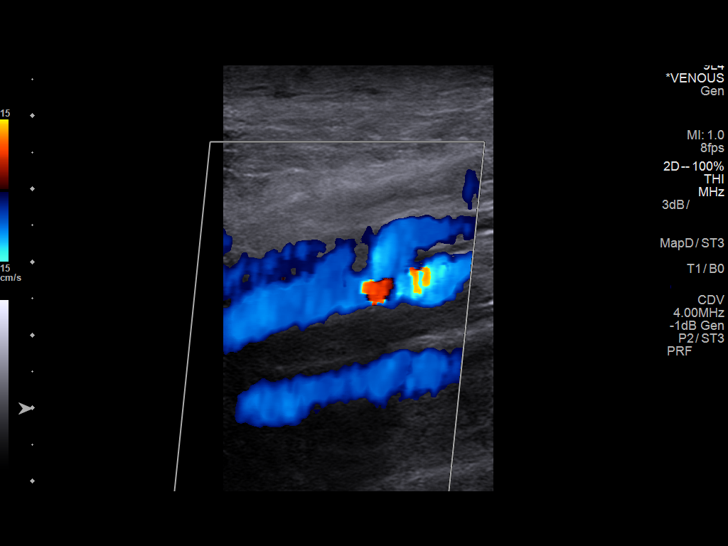
[im 31/38]
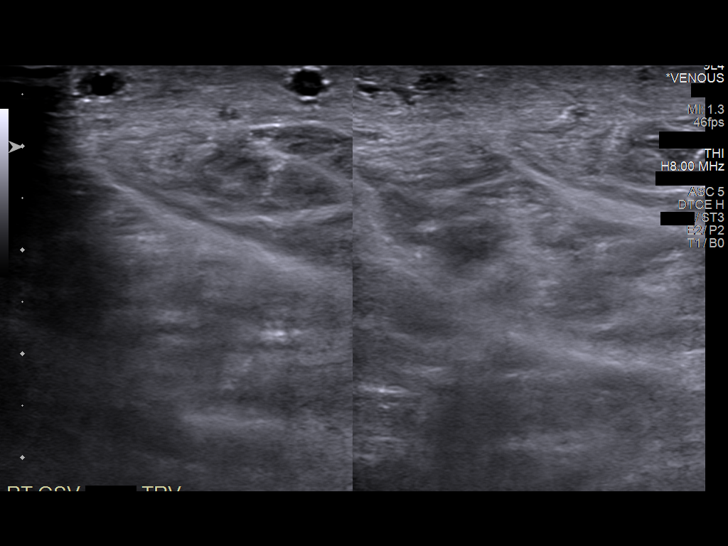
[im 34/38]
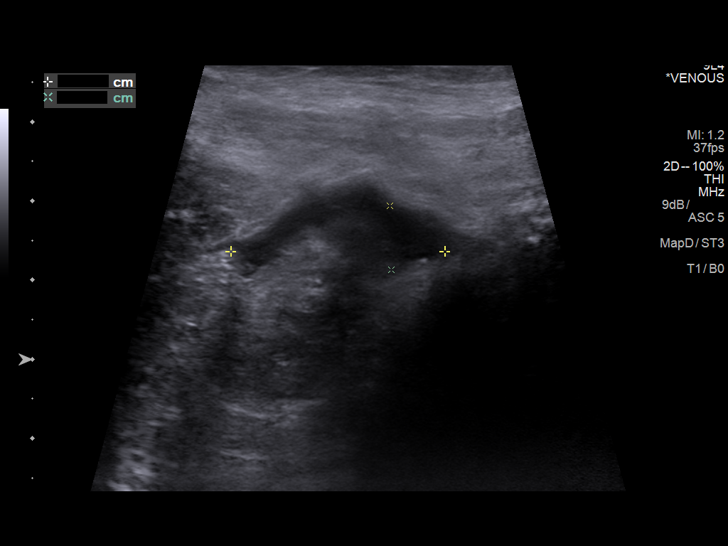
[im 38/38]
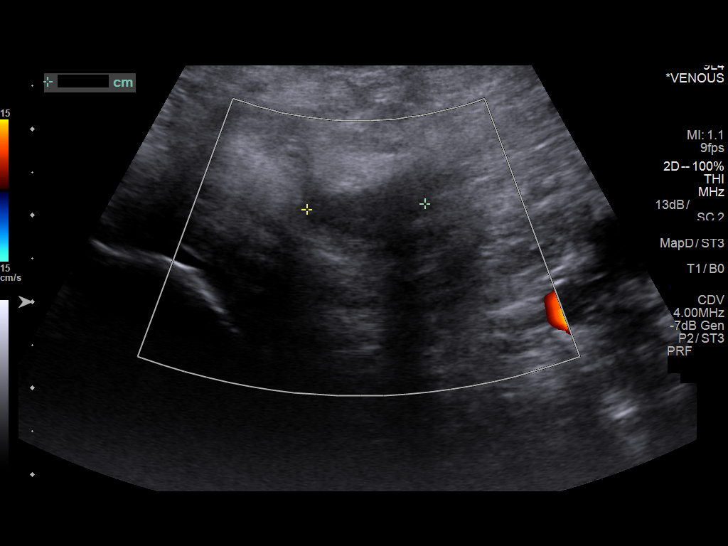

[13 of 24 positions shown; findings below may reference images not displayed]

FINDINGS: Contralateral Common Femoral Vein: Respiratory phasicity is normal
and symmetric with the symptomatic side. No evidence of thrombus.
Normal compressibility.

Common Femoral Vein: No evidence of thrombus. Normal
compressibility, respiratory phasicity and response to augmentation.

Saphenofemoral Junction: No evidence of thrombus. Normal
compressibility and flow on color Doppler imaging.

Profunda Femoral Vein: No evidence of thrombus. Normal
compressibility and flow on color Doppler imaging.

Femoral Vein: No evidence of thrombus. Normal compressibility,
respiratory phasicity and response to augmentation.

Popliteal Vein: No evidence of thrombus. Normal compressibility,
respiratory phasicity and response to augmentation.

Calf Veins: No evidence of thrombus. Normal compressibility and flow
on color Doppler imaging.

Superficial Great Saphenous Vein: No evidence of thrombus. Normal
compressibility.

Venous Reflux:  None.

Other Findings: There is a serpiginous approximately 2.7 x 0.8 x
cm fluid collection about the right popliteal fossa presumably a
Baker cyst. Moderate amount of subcutaneous edema is noted at the
level the left lower leg and calf.
IMPRESSION: No evidence of DVT within the right lower extremity.
